# Patient Record
Sex: Female | Born: 1990 | Race: White | Hispanic: No | Smoking: Current every day smoker
Health system: Southern US, Community
[De-identification: ages and names within clinical notes are randomized; demographics above are authoritative.]

## PROBLEM LIST (undated history)

## (undated) DIAGNOSIS — D649 Anemia, unspecified: Secondary | ICD-10-CM

## (undated) DIAGNOSIS — L732 Hidradenitis suppurativa: Secondary | ICD-10-CM

## (undated) HISTORY — PX: TONSILLECTOMY AND ADENOIDECTOMY: SUR1326

## (undated) HISTORY — PX: CHOLECYSTECTOMY: SHX55

## (undated) HISTORY — DX: Hidradenitis suppurativa: L73.2

---

## 2018-03-14 ENCOUNTER — Other Ambulatory Visit (HOSPITAL_COMMUNITY): Payer: Self-pay | Admitting: Obstetrics and Gynecology

## 2018-03-14 DIAGNOSIS — Z3689 Encounter for other specified antenatal screening: Secondary | ICD-10-CM

## 2018-03-14 DIAGNOSIS — O280 Abnormal hematological finding on antenatal screening of mother: Secondary | ICD-10-CM

## 2018-03-18 ENCOUNTER — Encounter (HOSPITAL_COMMUNITY): Payer: Self-pay | Admitting: *Deleted

## 2018-03-20 ENCOUNTER — Encounter (HOSPITAL_COMMUNITY): Payer: Self-pay

## 2018-03-20 ENCOUNTER — Other Ambulatory Visit (HOSPITAL_COMMUNITY): Payer: Self-pay | Admitting: Obstetrics and Gynecology

## 2018-03-20 ENCOUNTER — Ambulatory Visit (HOSPITAL_COMMUNITY)
Admission: RE | Admit: 2018-03-20 | Discharge: 2018-03-20 | Disposition: A | Discharge status: Home / Self Care | Payer: Medicaid Other | Source: Ambulatory Visit | Attending: Obstetrics and Gynecology | Admitting: Obstetrics and Gynecology

## 2018-03-20 ENCOUNTER — Other Ambulatory Visit: Payer: Self-pay

## 2018-03-20 ENCOUNTER — Other Ambulatory Visit (HOSPITAL_COMMUNITY): Payer: Self-pay | Admitting: *Deleted

## 2018-03-20 DIAGNOSIS — O281 Abnormal biochemical finding on antenatal screening of mother: Secondary | ICD-10-CM | POA: Insufficient documentation

## 2018-03-20 DIAGNOSIS — Z3A23 23 weeks gestation of pregnancy: Secondary | ICD-10-CM | POA: Insufficient documentation

## 2018-03-20 DIAGNOSIS — O34219 Maternal care for unspecified type scar from previous cesarean delivery: Secondary | ICD-10-CM | POA: Diagnosis not present

## 2018-03-20 DIAGNOSIS — O280 Abnormal hematological finding on antenatal screening of mother: Secondary | ICD-10-CM

## 2018-03-20 DIAGNOSIS — Z3689 Encounter for other specified antenatal screening: Secondary | ICD-10-CM

## 2018-03-20 DIAGNOSIS — Z363 Encounter for antenatal screening for malformations: Secondary | ICD-10-CM | POA: Diagnosis present

## 2018-03-20 DIAGNOSIS — O289 Unspecified abnormal findings on antenatal screening of mother: Secondary | ICD-10-CM

## 2018-03-20 HISTORY — DX: Anemia, unspecified: D64.9

## 2018-03-20 NOTE — Progress Notes (Signed)
Genetic Counseling  High-Risk Gestation Note  Appointment Date:  03/20/2018 Referred By: Jean Ortega, [name], DO Date of Birth:  1991/11/19   Pregnancy History: [id number] Estimated Date of Delivery: 07/15/18 Estimated Gestational Age: 4076w2d Attending: Particia NearingMartha Decker, MD   Jean Ortega was seen for genetic counseling because of an increased risk for fetal Down syndrome based on Quad screen through [facility].  In summary:  Reviewed results of Quad screening test  Increased risk for Down syndrome (1 in 102)  Discussed additional screening options  NIPS- declined today; plans to discuss further with partner  Ultrasound- performed today; see separate report  Ultrasound today visualized mild urinary tract dilation  Follow-up ultrasound scheduled 05/01/18  Reviewed association with UTD and soft marker for aneuploidy  Adjusted risk for fetal Down syndrome approximately 1 in 65  Discussed diagnostic testing options  Amniocentesis- declined  Reviewed family history concerns  Discussed general population carrier screening options- declined  CF  SMA  Hemoglobinopathies  She was counseled regarding the screening result and the associated 1 in 102 risk for fetal Down syndrome.  We reviewed chromosomes, nondisjunction, and the common features and variable prognosis of Down syndrome.  In addition, we reviewed the screen adjusted reduction in risks for trisomy 18 and open neural tube defects.  We also discussed other explanations for a screen positive result including: a gestational dating error, differences in maternal metabolism, and normal variation. Additionally, the levels of one of the analytes assessed, DIA, was very high (2.10 MoM). This has been associated with increased risk for adverse pregnancy outcomes. Consider ultrasound assessment in third trimester to assess fetal growth.   Detailed ultrasound was performed today. Mild urinary tract dilation (UTD A1) visualized today.  Remaining visualized fetal anatomy within normal limits. See separate ultrasound report. We reviewed the benefits and limitations of detailed ultrasound as a screening tool for fetal aneuploidy, specifically for Down syndrome. We discussed that fetal urinary tract dilation (pyelectasis) is defined as the dilatation of the fetal renal pelvis/pelvises due to excess urine. This finding is estimated to occur in 2-3% of fetuses.  The female to female ratio is 2:1.  Typically, babies with mild pyelectasis are born normal and healthy and we are usually unable to determine why this extra fluid is present.  This urine accumulation may regress, stay the same or continue to accumulate.  The more fluid that accumulates, the more likely this fluid could be the result of a compromise in kidney function, an obstruction, or narrowing of the ureters which transport urine out of the body, thus causing backflow of fluid into the kidneys.  Therefore, it is important to follow pyelectasis to make sure it does not become more concerning.  Also, in some cases postnatal evaluation of baby's kidneys may be warranted.  We discussed that the finding of pyelectasis is associated with an increased risk for fetal aneuploidy.  This risk is highest when other anomalies or fetal differences are visualized. We discussed that this finding may slightly adjust the Quad screen risk from 1 in 102 to approximately 1 in 65.   We reviewed other available screening options including noninvasive prenatal screening (NIPS)/cell free DNA (cfDNA) screening.  She was counseled that screening tests are used to modify a patient's a priori risk for aneuploidy, typically based on age. This estimate provides a pregnancy specific risk assessment. We reviewed the benefits and limitations of each option. Specifically, we discussed the conditions for which each test screens, the detection rates, and false positive rates  of each. She was also counseled regarding diagnostic  testing via amniocentesis. We reviewed the approximate 1 in 300-500 risk for complications from amniocentesis, including spontaneous pregnancy loss or preterm labor and delivery at this later gestational age. We discussed the possible results that the tests might provide including: positive, negative, unanticipated, and no result. Finally, they were counseled regarding the cost of each option and potential out of pocket expenses.   After consideration of all the options, she declined amniocentesis. She also declined NIPS today but stated that she plans to discuss this further with her partner, who was not able to be here today, and will contact our office should she desire for [place] to facilitate NIPS in the pregnancy.   She understands that screening tests cannot rule out all birth defects or genetic syndromes. The patient was advised of this limitation and states she still does not want additional testing at this time.   Jean Ortega was provided with written information regarding cystic fibrosis (CF), spinal muscular atrophy (SMA) and hemoglobinopathies including the carrier frequency, availability of carrier screening and prenatal diagnosis if indicated.  In addition, we discussed that CF and hemoglobinopathies are routinely screened for as part of the [place] newborn screening panel.  After further discussion, she declined screening for CF, SMA and hemoglobinopathies.   Both family histories were reviewed and found to be contributory for possible Turner syndrome in the patient's sister. The patient reported that her mother underwent amniocentesis during pregnancy with the patient's sister, and that her sister was supposed to have Turner syndrome. Her sister is currently 83 years old and was described to have minimal features of Turner syndrome, with her symptoms described to be numerous moles on her skin and ophthalmology concerns. Medical records were not available to review her sister's karyotype  analysis. We reviewed that Turner syndrome is typically caused by 45, X (monosomy X) and is sporadic. Recurrence risk for relatives would not be expected to be increased. However, additional information regarding her sister's etiology may alter recurrence risk for relatives.   Additionally, Jean Ortega reported a maternal family history of retinitis pigmentosa (RP). She reported that her maternal grandfather was diagnosed with RP in his 6's-30's and is currently in his 10's. The patient's maternal aunt, currently in her 55's, also reportedly has RP, diagnosed at a similar age to the patient's grandfather. The patient's grandfather's brother's son also reportedly has RP; this relative is currently in his 88's. No additional relatives were reported with vision loss or a diagnosis of RP. The patient's mother is currently 73 years old and reportedly has not had vision loss. The relatives with RP were not reported to have additional medical concerns, and a specific etiology for RP was not known by the patient at today's visit. RP occurs in ~1 in 3000-7000 individuals. RP refers to a group of inherited disorders in which abnormalities of the photoreceptors or the retinal pigment epithelium of the retina lead to progressive visual loss. Most affected individuals first experience night blindness and eventually have loss of central vision late in the course of the disease. We discussed that the genetics of RP is quite complex, with significant genetic heterogeneity being observed. To date more than 40 genes are known to contribute to causation. RP is inherited in a variety of patterns including: autosomal recessive, X-linked, autosomal dominant, and rarely digenic patterns. She was counseled that RP can be nonsyndromic (only affecting vision) or a feature of an underlying genetic syndrome (affecting more than  one organ system). We discussed that the family history is most suggestive of an autosomal dominant pattern, in which  case offspring of an affected individual have a 1 in 2 (50%) risk to also inherit RP. Offspring of unaffected individuals would not be at increased risk.  A consultation with a medical geneticist is available to the patient's affected relatives, if desired, to help clarify the underlying etiology.  Other forms of inheritance, though less likely given the reportedly family history, cannot be ruled out without additional information. We discussed the importance of relatives informing their primary care physician of this family history so that receive appropriate ophthalmology assessment. Without further information regarding the provided family history, an accurate genetic risk cannot be calculated. Further genetic counseling is warranted if more information is obtained.  Jean Ortega denied exposure to environmental toxins or chemical agents. She denied the use of alcohol or street drugs. She reported smoking a half pack of cigarettes per day. The associations of smoking in pregnancy were reviewed and cessation encouraged. She denied significant viral illnesses during the course of her pregnancy.   I counseled Jean Ortega for approximately 45 minutes regarding the above risks and available options.   Quinn Plowman, MS,  Certified [employer] 03/20/2018

## 2018-05-01 ENCOUNTER — Ambulatory Visit (HOSPITAL_COMMUNITY)
Admission: RE | Admit: 2018-05-01 | Discharge: 2018-05-01 | Disposition: A | Discharge status: Home / Self Care | Payer: Medicaid Other | Source: Ambulatory Visit | Attending: Obstetrics and Gynecology | Admitting: Obstetrics and Gynecology

## 2018-05-01 ENCOUNTER — Encounter (HOSPITAL_COMMUNITY): Payer: Self-pay

## 2018-05-01 DIAGNOSIS — Z363 Encounter for antenatal screening for malformations: Secondary | ICD-10-CM | POA: Insufficient documentation

## 2018-05-01 DIAGNOSIS — O289 Unspecified abnormal findings on antenatal screening of mother: Secondary | ICD-10-CM | POA: Insufficient documentation

## 2018-05-01 DIAGNOSIS — Z3A29 29 weeks gestation of pregnancy: Secondary | ICD-10-CM | POA: Insufficient documentation

## 2018-05-01 DIAGNOSIS — O99333 Smoking (tobacco) complicating pregnancy, third trimester: Secondary | ICD-10-CM | POA: Diagnosis not present

## 2018-05-07 ENCOUNTER — Other Ambulatory Visit: Payer: Self-pay

## 2018-05-08 ENCOUNTER — Telehealth (HOSPITAL_COMMUNITY): Payer: Self-pay | Admitting: MS"

## 2018-05-08 NOTE — Telephone Encounter (Signed)
Called [name] to discuss her prenatal cell free DNA test results.  Ms. Jean Ortega had [facility] testing through [facility].  Testing was offered because of abnormal maternal serum screening.   The patient was identified by name and DOB.  We reviewed that these are within normal limits, showing a less than 1 in 10,000 risk for trisomies 21, 18 and 13, and monosomy X (Turner syndrome).  In addition, the risk for triploidy and sex chromosome trisomies (47,XXX and 47,XXY) was also low risk. We reviewed that this testing identifies > 99% of pregnancies with trisomy 5521, trisomy 913, sex chromosome trisomies (47,XXX and 47,XXY), and triploidy. The detection rate for trisomy 18 is 96%.  The detection rate for monosomy X is ~92%.  The false positive rate is <0.1% for all conditions. Testing was also consistent with female fetal sex.  The patient did wish to know fetal sex.  She understands that this testing does not identify all genetic conditions.  All questions were answered to her satisfaction, she was encouraged to call with additional questions or concerns.  Jean PlowmanKaren Leotha Westermeyer, MS Certified Genetic Counselor 05/08/2018 10:26 AM

## 2018-10-07 ENCOUNTER — Encounter (HOSPITAL_COMMUNITY): Payer: Self-pay

## 2019-01-23 IMAGING — US US MFM OB FOLLOW-UP
1 series · 14 of 28 positions shown · non-contrast
Comparison: none

[Series 1: us mfm ob follow-up · 43 acquisitions, 14 frames shown]
[im 2/43]
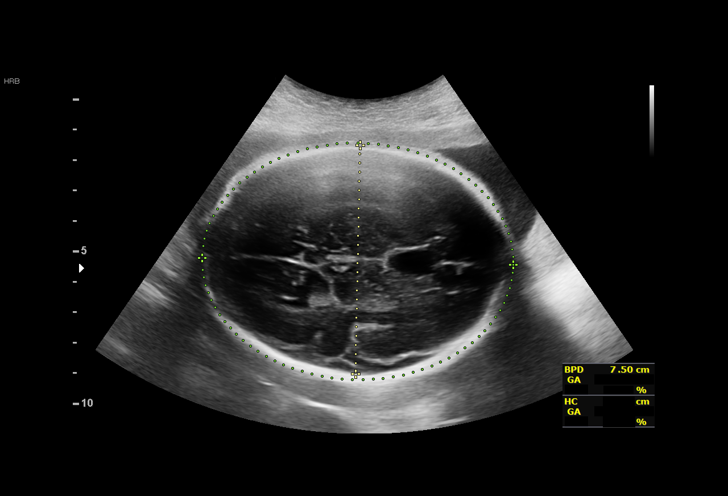
[im 5/43]
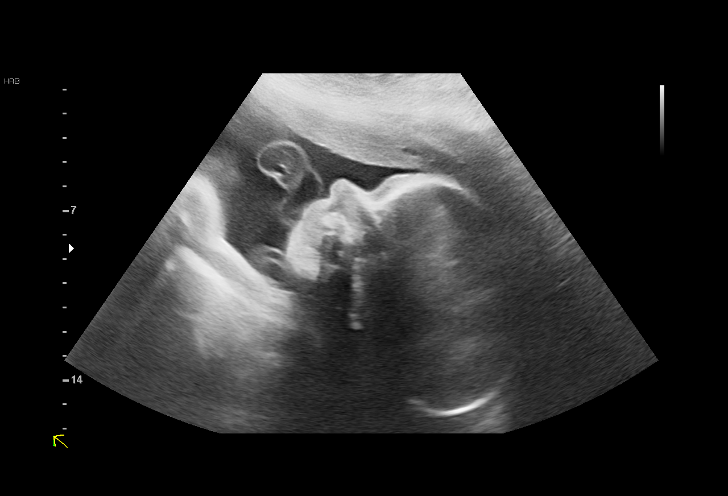
[im 8/43]
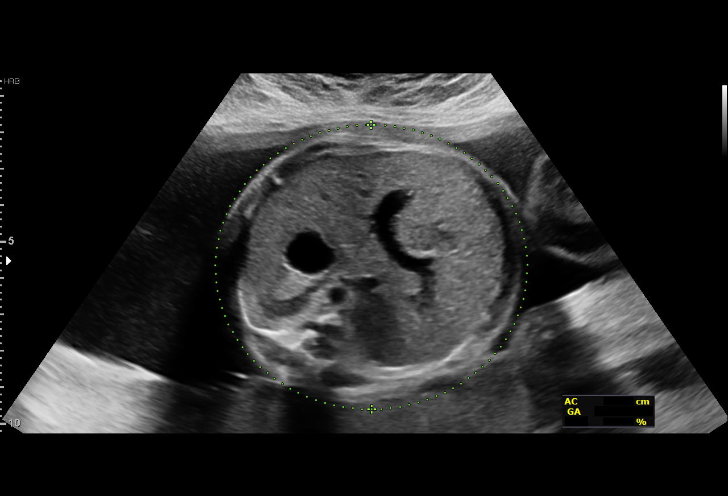
[im 11/43]
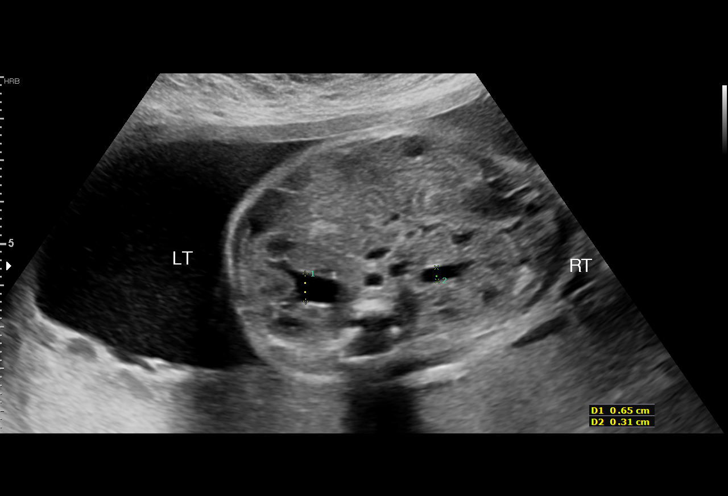
[im 15/43]
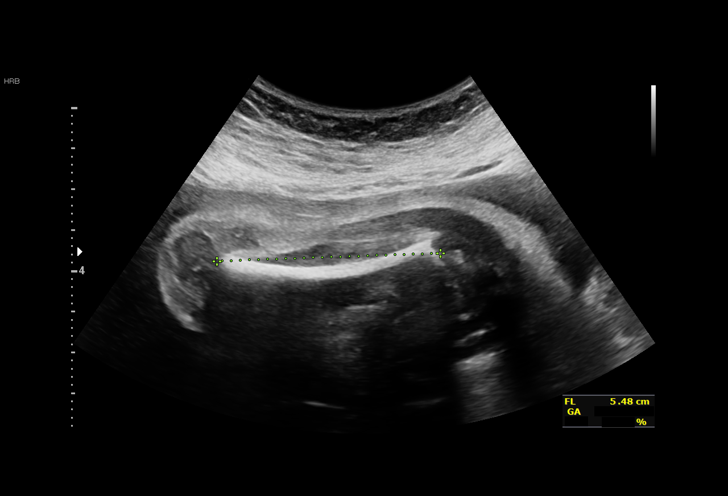
[im 18/43]
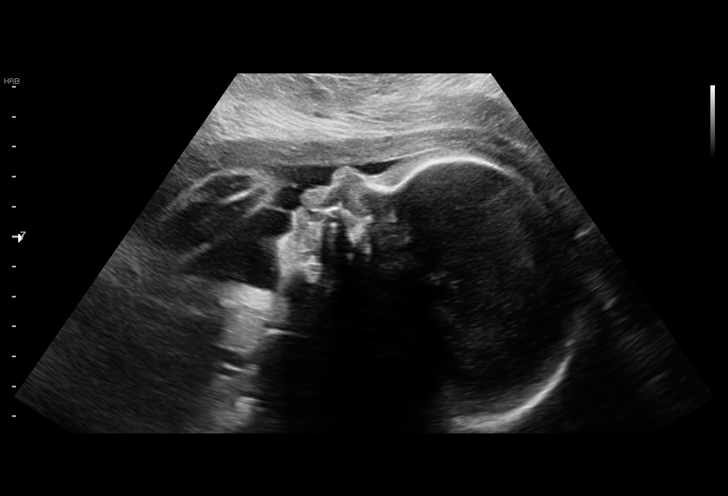
[im 21/43]
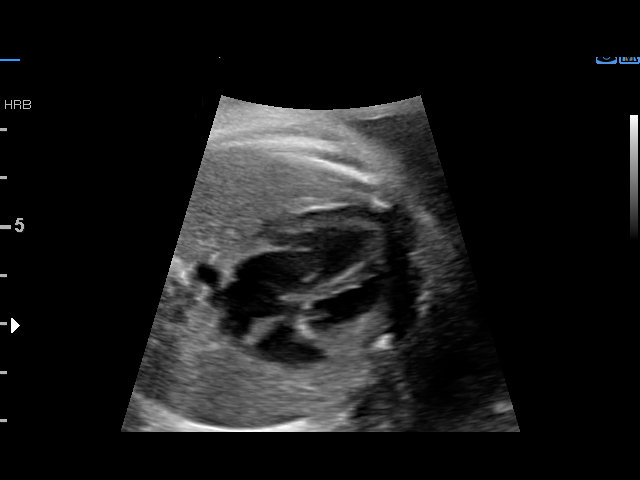
[im 24/43]
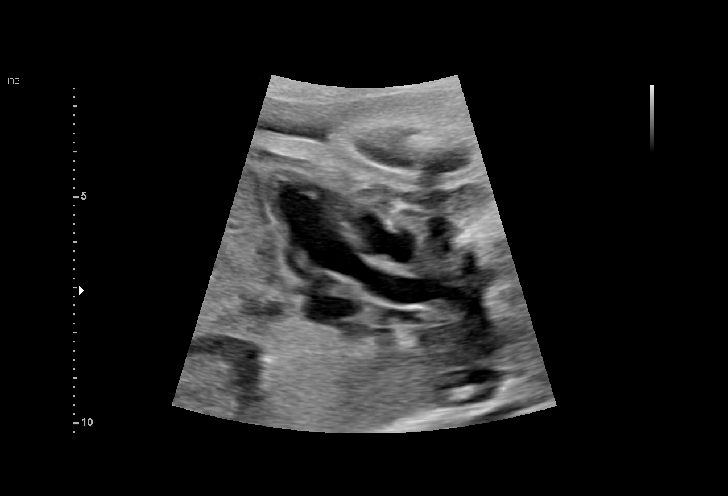
[im 27/43]
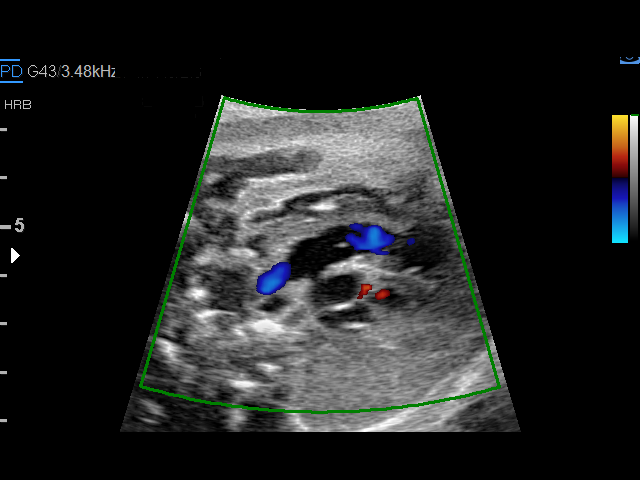
[im 30/43]
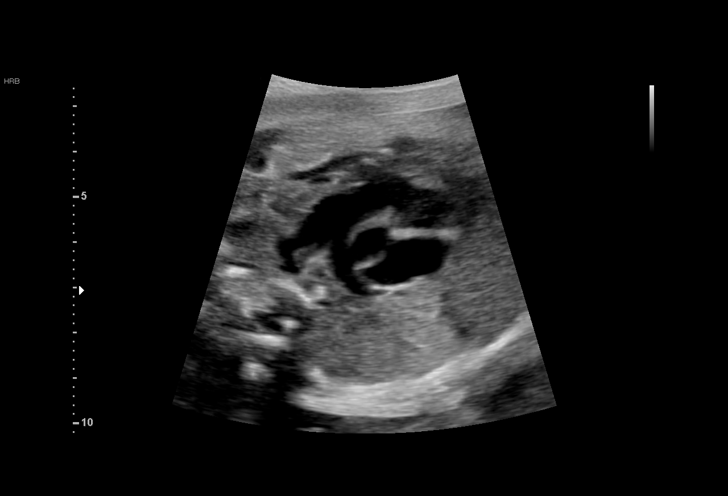
[im 33/43]
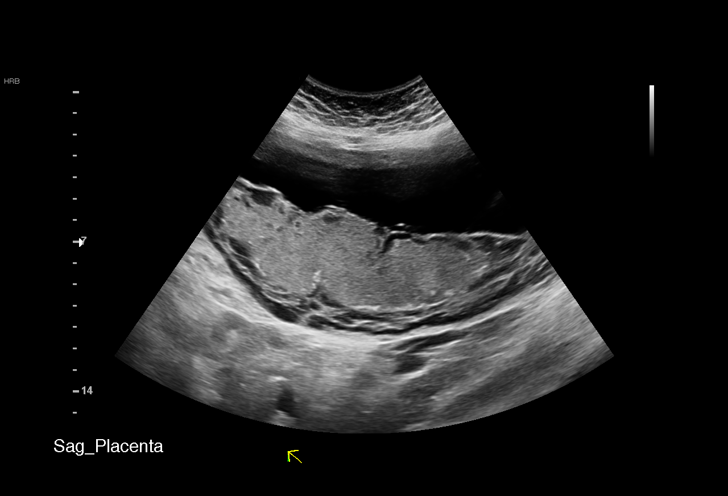
[im 36/43]
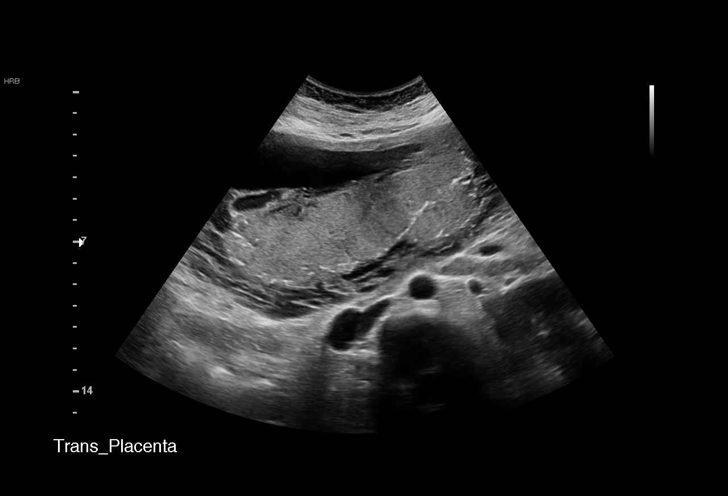
[im 39/43]
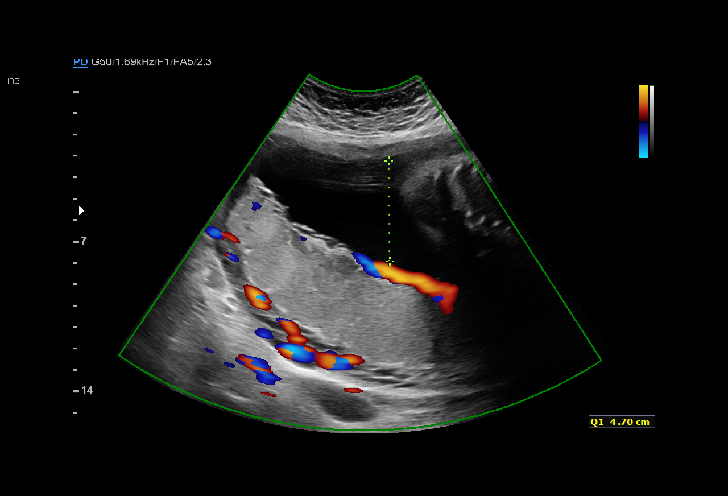
[im 43/43]
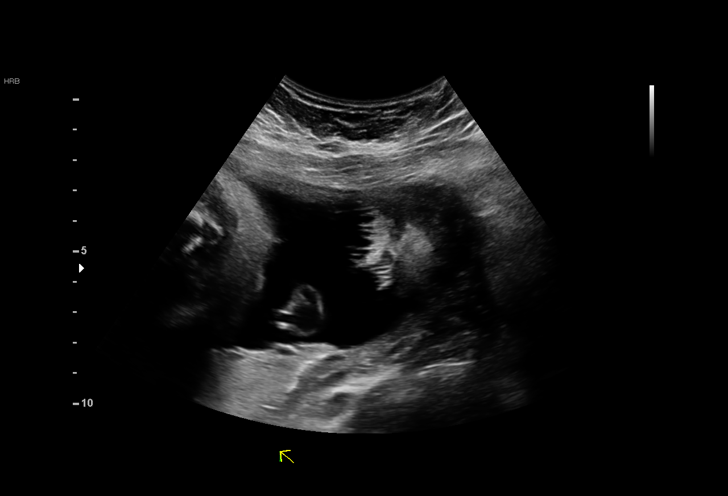

[14 of 28 positions shown; findings below may reference images not displayed]

POLIN DO

1  ROSSY TEHUACAN            [id number]      [id number]     [id number]
Indications

29 weeks gestation of pregnancy
History of cesarean delivery, currently
pregnant (x2)
Encounter for antenatal screening for
malformations
Abnormal biochemical screen (quad) for
Trisomy 21; 1 in 102 DSR
Tobacco use complicating pregnancy, third
trimester
OB History

Gravidity:    3         Term:   2
Living:       2
Fetal Evaluation

Num Of Fetuses:     1
Fetal Heart         150
Rate(bpm):
Cardiac Activity:   Observed
Presentation:       Cephalic
Placenta:           Posterior, above cervical os
P. Cord Insertion:  Previously Visualized

Amniotic Fluid
AFI FV:      Subjectively within normal limits

AFI Sum(cm)     %Tile       Largest Pocket(cm)
18.14           69

RUQ(cm)       RLQ(cm)       LUQ(cm)        LLQ(cm)
4.7
Biometry

BPD:      75.1  mm     G. Age:  30w 1d         65  %    CI:        71.12   %    70 - 86
FL/HC:      18.9   %    19.6 -
HC:      283.7  mm     G. Age:  31w 1d         71  %    HC/AC:      1.12        0.99 -
AC:      252.3  mm     G. Age:  29w 3d         48  %    FL/BPD:     71.2   %    71 - 87
FL:       53.5  mm     G. Age:  28w 3d         15  %    FL/AC:      21.2   %    20 - 24

Est. FW:    0479  gm           3 lb     52  %
Gestational Age

LMP:           29w 2d        Date:  10/08/17                 EDD:   07/15/18
U/S Today:     29w 6d                                        EDD:   07/11/18
Best:          29w 2d     Det. By:  LMP  (10/08/17)          EDD:   07/15/18
Anatomy

Cranium:               Appears normal         Aortic Arch:            Previously seen
Cavum:                 Appears normal         Ductal Arch:            Previously seen
Ventricles:            Appears normal         Diaphragm:              Appears normal
Choroid Plexus:        Previously seen        Stomach:                Appears normal, left
sided
Cerebellum:            Previously seen        Abdomen:                Appears normal
Posterior Fossa:       Previously seen        Abdominal Wall:         Previously seen
Nuchal Fold:           Previously seen        Cord Vessels:           Previously seen
Face:                  Profile nl; orbits     Kidneys:                Appear normal
previously seen
Lips:                  Previously seen        Bladder:                Appears normal
Thoracic:              Appears normal         Spine:                  Previously seen
Heart:                 Appears normal         Upper Extremities:      Previously seen
(4CH, axis, and situs
RVOT:                  Appears normal         Lower Extremities:      Previously seen
LVOT:                  Appears normal

Other:  Male gender. Heels visualized. Technically difficult due to fetal
position.
Cervix Uterus Adnexa

Cervix
Not visualized (advanced GA >60wks)
Impression

SIUP at 29+2 weeks
Normal interval anatomy; anatomic survey complete
Normal amniotic fluid volume
Appropriate interval growth with EFW at the 52nd %tile

Ms. Goos opted for cell free DNA screening today
Recommendations

Follow-up as clinically indicated

## 2023-03-12 ENCOUNTER — Ambulatory Visit (INDEPENDENT_AMBULATORY_CARE_PROVIDER_SITE_OTHER): Payer: Medicaid Other | Admitting: Obstetrics and Gynecology

## 2023-03-12 ENCOUNTER — Encounter: Payer: Self-pay | Admitting: Obstetrics and Gynecology

## 2023-03-12 VITALS — BP 127/84 | HR 80 | Ht 66.0 in | Wt 239.0 lb

## 2023-03-12 DIAGNOSIS — N938 Other specified abnormal uterine and vaginal bleeding: Secondary | ICD-10-CM

## 2023-03-12 NOTE — Progress Notes (Signed)
Pt ins new to office for cycle concerns. Pt states she has always had irregular cycle.  Pt had SAB in Sept 2023. Pt states she had u/s afterward and was seen to have cystic ovaries. Pt states since Dec she will have bleeding every other week, having increase in clots.   Pt has no current BC.

## 2023-03-12 NOTE — Progress Notes (Signed)
  CC: abnormal bleeding Subjective:    Patient ID: Jean Ortega, female    DOB: 04/25/1991, 32 y.o.   MRN: [id number]  HPI 32 yo G4P3, c/s x 3,  seen for report of irregular bleeding.  Pt states she had SAB 9/23, but has had irregular bleeding before and after the incident.  Menarche age [age].  She has never had regular menses.  The patient has not tried OCP for cycle control.  Last OCP use was in 2019 and was possibly POPs.  Pt does smoke 1 ppd.  Pt is unsure if she has ever been diagnosed with PCOS.  Reviewed recent [facility] labs in the patient's mychart.  Last TSH about a year ago was slightly low with normal thyroid function.  U/S 9 and 10/23 was more for rule out ectopic during her SAB.     Review of Systems     Objective:   Physical Exam Genitourinary:    Comments: SSE: normal vagina Small normal appearing cervix. SVE: normal sized uterus with normal contours.   Vitals:   03/12/23 1028  BP: 127/84  Pulse: 80         Assessment & Plan:   1. DUB (dysfunctional uterine bleeding) Discussed possibility of PCOS, will check labs. Check pelvic u/s for non pregnant eval of uterus and to follow up on previous right ovarian cyst. Discussed need for weight loss as part of treatment plan. If labs and u/s normal will proceed with trial of OCP. Pt does smoke and has been made aware cann ot use OCP at 35 or older with current tobacco use. Pt does not desire definitve therapy at this time.  - Follicle stimulating hormone - Thyroid Panel With TSH - Prolactin - HgB A1c - Testosterone,Free and Total - US PELVIC COMPLETE WITH TRANSVAGINAL; Future  F/u in 5-6 weeks  Warden Fillers, MD Faculty Attending, Center for [facility]

## 2023-03-15 LAB — TESTOSTERONE,FREE AND TOTAL
Testosterone, Free: 0.7 pg/mL (ref 0.0–4.2)
Testosterone: 16 ng/dL (ref 8–60)

## 2023-03-15 LAB — THYROID PANEL WITH TSH
Free Thyroxine Index: 2.7 (ref 1.2–4.9)
T3 Uptake Ratio: 27 % (ref 24–39)
T4, Total: 10 ug/dL (ref 4.5–12.0)
TSH: 1.09 u[IU]/mL (ref 0.450–4.500)

## 2023-03-15 LAB — FOLLICLE STIMULATING HORMONE: FSH: 6.3 m[IU]/mL

## 2023-03-15 LAB — HEMOGLOBIN A1C
Est. average glucose Bld gHb Est-mCnc: 103 mg/dL
Hgb A1c MFr Bld: 5.2 % (ref 4.8–5.6)

## 2023-03-15 LAB — PROLACTIN: Prolactin: 3.1 ng/mL — ABNORMAL LOW (ref 4.8–33.4)

## 2023-03-21 ENCOUNTER — Ambulatory Visit (HOSPITAL_BASED_OUTPATIENT_CLINIC_OR_DEPARTMENT_OTHER)
Admission: RE | Admit: 2023-03-21 | Discharge: 2023-03-21 | Disposition: A | Discharge status: Home / Self Care | Payer: Medicaid Other | Source: Ambulatory Visit | Attending: Obstetrics and Gynecology | Admitting: Obstetrics and Gynecology

## 2023-03-21 DIAGNOSIS — N938 Other specified abnormal uterine and vaginal bleeding: Secondary | ICD-10-CM | POA: Insufficient documentation

## 2023-04-29 ENCOUNTER — Ambulatory Visit (INDEPENDENT_AMBULATORY_CARE_PROVIDER_SITE_OTHER): Payer: Medicaid Other | Admitting: Obstetrics and Gynecology

## 2023-04-29 VITALS — BP 129/85 | HR 99 | Wt 228.0 lb

## 2023-04-29 DIAGNOSIS — N926 Irregular menstruation, unspecified: Secondary | ICD-10-CM | POA: Diagnosis not present

## 2023-04-29 NOTE — Progress Notes (Signed)
Pt is in office for follow up for results and irregular bleeding.

## 2023-04-29 NOTE — Progress Notes (Signed)
  CC: gyn follow up Subjective:    Patient ID: Jean Ortega, female    DOB: 04-26-1991, 32 y.o.   MRN: [id number]  HPI Pt seen for follow up.  Reviewed ultrasound and lab findings which were normal.  Pt has lost 11-13 pounds since last visit.  She notes she has essentially normal periods 5-7 days monthly.  She is currently not skipping months.   Review of Systems     Objective:   Physical Exam Vitals:   04/29/23 1043  BP: 129/85  Pulse: 99   CLINICAL DATA:  Dysfunctional uterine bleeding. The patient's last menstrual period was 03/06/2023.   EXAM: TRANSABDOMINAL AND TRANSVAGINAL ULTRASOUND OF PELVIS   TECHNIQUE: Both transabdominal and transvaginal ultrasound examinations of the pelvis were performed. Transabdominal technique was performed for global imaging of the pelvis including uterus, ovaries, adnexal regions, and pelvic cul-de-sac. It was necessary to proceed with endovaginal exam following the transabdominal exam to visualize the uterus.   COMPARISON:  Pelvic ultrasound dated 05/01/2018.   FINDINGS: Uterus   Measurements: 7.9 x 5.3 x 6.6 cm = volume: 144 mL. No fibroids or other mass visualized.   Endometrium   Thickness: 7 mm.  No focal abnormality visualized.   Right ovary   Measurements: 3.0 x 2.2 x 2.4 cm = volume: 8 mL. Normal appearance/no adnexal mass.   Left ovary   Measurements: 3.1 x 1.6 x 2.3 cm = volume: 6 mL. Normal appearance/no adnexal mass.   Other findings   Trace free fluid.   IMPRESSION: Trace free fluid in the pelvis is likely physiologic. Otherwise normal pelvic ultrasound.          Assessment & Plan:   Irregular menses:  Discussed expectant management versus OCP since pt is having monthly menses with appropriate duration. Pt is aware if bleeding becomes more irregular or heavy OCP would be most appropriate next step. Pt advised to continue weight loss if possible.  I spent 10 minutes dedicated to the care of  this patient including previsit review of records, face to face time with the patient discussing treatment options and post visit testing.    Warden Fillers, MD Faculty Attending, Center for [facility]
